# Patient Record
Sex: Male | Born: 1945 | Race: Black or African American | Hispanic: No | Marital: Single
Health system: Southern US, Community
[De-identification: ages and names within clinical notes are randomized; demographics above are authoritative.]

---

## 2007-12-20 ENCOUNTER — Ambulatory Visit: Payer: Self-pay | Admitting: Nephrology

## 2008-03-14 ENCOUNTER — Ambulatory Visit: Payer: Self-pay | Admitting: Ophthalmology

## 2008-03-21 ENCOUNTER — Ambulatory Visit: Payer: Self-pay | Admitting: Ophthalmology

## 2009-08-19 ENCOUNTER — Ambulatory Visit: Payer: Self-pay | Admitting: General Practice

## 2011-04-13 ENCOUNTER — Ambulatory Visit: Payer: Self-pay | Admitting: Urology

## 2011-04-29 ENCOUNTER — Ambulatory Visit: Payer: Self-pay | Admitting: Urology

## 2011-04-30 LAB — URINE CULTURE

## 2011-05-06 ENCOUNTER — Ambulatory Visit: Payer: Self-pay | Admitting: Urology

## 2011-06-18 ENCOUNTER — Ambulatory Visit: Payer: Self-pay

## 2011-06-19 ENCOUNTER — Ambulatory Visit: Payer: Self-pay | Admitting: Otolaryngology

## 2011-09-28 ENCOUNTER — Ambulatory Visit: Payer: Self-pay | Admitting: Urology

## 2011-09-29 LAB — URINE CULTURE

## 2011-09-30 ENCOUNTER — Ambulatory Visit: Payer: Self-pay | Admitting: Urology

## 2012-05-19 ENCOUNTER — Ambulatory Visit: Payer: Self-pay | Admitting: Otolaryngology

## 2012-06-12 ENCOUNTER — Ambulatory Visit: Payer: Self-pay | Admitting: Internal Medicine

## 2012-07-02 ENCOUNTER — Inpatient Hospital Stay: Payer: Self-pay | Admitting: Student

## 2012-07-02 LAB — COMPREHENSIVE METABOLIC PANEL
Albumin: 2 g/dL — ABNORMAL LOW (ref 3.4–5.0)
BUN: 174 mg/dL — ABNORMAL HIGH (ref 7–18)
Bilirubin,Total: 0.2 mg/dL (ref 0.2–1.0)
Co2: 21 mmol/L (ref 21–32)
Creatinine: 7.15 mg/dL — ABNORMAL HIGH (ref 0.60–1.30)
EGFR (African American): 9 — ABNORMAL LOW
Glucose: 195 mg/dL — ABNORMAL HIGH (ref 65–99)
SGOT(AST): 128 U/L — ABNORMAL HIGH (ref 15–37)
Total Protein: 8.9 g/dL — ABNORMAL HIGH (ref 6.4–8.2)

## 2012-07-02 LAB — URINALYSIS, COMPLETE
Bilirubin,UR: NEGATIVE
Ketone: NEGATIVE
Nitrite: NEGATIVE
Ph: 7 (ref 4.5–8.0)
Protein: 100
RBC,UR: 20 /HPF (ref 0–5)
Squamous Epithelial: NONE SEEN
WBC UR: 617 /HPF (ref 0–5)

## 2012-07-02 LAB — PROTIME-INR: Prothrombin Time: 16.7 secs — ABNORMAL HIGH (ref 11.5–14.7)

## 2012-07-02 LAB — CBC
HGB: 9.4 g/dL — ABNORMAL LOW (ref 13.0–18.0)
MCH: 26.6 pg (ref 26.0–34.0)
RBC: 3.52 10*6/uL — ABNORMAL LOW (ref 4.40–5.90)

## 2012-07-02 LAB — TROPONIN I: Troponin-I: <0.02 ng/mL

## 2012-07-02 LAB — POTASSIUM: Potassium: 6 mmol/L — ABNORMAL HIGH (ref 3.5–5.1)

## 2012-07-03 LAB — BASIC METABOLIC PANEL
Anion Gap: 9 (ref 7–16)
BUN: 156 mg/dL — ABNORMAL HIGH (ref 7–18)
BUN: 145 mg/dL — ABNORMAL HIGH (ref 7–18)
BUN: 134 mg/dL — ABNORMAL HIGH (ref 7–18)
Calcium, Total: 8.8 mg/dL (ref 8.5–10.1)
Calcium, Total: 8.5 mg/dL (ref 8.5–10.1)
Chloride: >128 mmol/L — ABNORMAL HIGH (ref 98–107)
Chloride: 128 mmol/L — ABNORMAL HIGH (ref 98–107)
Co2: 22 mmol/L (ref 21–32)
Co2: 21 mmol/L (ref 21–32)
Creatinine: 6.24 mg/dL — ABNORMAL HIGH (ref 0.60–1.30)
EGFR (African American): 10 — ABNORMAL LOW
EGFR (African American): 11 — ABNORMAL LOW
EGFR (African American): 13 — ABNORMAL LOW
EGFR (Non-African Amer.): 9 — ABNORMAL LOW
EGFR (Non-African Amer.): 10 — ABNORMAL LOW
Glucose: 155 mg/dL — ABNORMAL HIGH (ref 65–99)
Glucose: 162 mg/dL — ABNORMAL HIGH (ref 65–99)
Osmolality: 360 (ref 275–301)
Potassium: 4.5 mmol/L (ref 3.5–5.1)
Potassium: 4.5 mmol/L (ref 3.5–5.1)
Potassium: 4.2 mmol/L (ref 3.5–5.1)
Sodium: >160 mmol/L (ref 136–145)
Sodium: 158 mmol/L — ABNORMAL HIGH (ref 136–145)

## 2012-07-03 LAB — RETICULOCYTES
Absolute Retic Count: 0.0107 10*6/uL — ABNORMAL LOW
Reticulocyte: 0.36 % — ABNORMAL LOW

## 2012-07-03 LAB — MAGNESIUM: Magnesium: 3.2 mg/dL — ABNORMAL HIGH

## 2012-07-03 LAB — IRON AND TIBC
Iron Saturation: 11 %
Iron: 11 ug/dL — ABNORMAL LOW (ref 65–175)
Unbound Iron-Bind.Cap.: 92 ug/dL

## 2012-07-03 LAB — FERRITIN: Ferritin (ARMC): 362 ng/mL (ref 8–388)

## 2012-07-03 LAB — CK: CK, Total: 931 U/L — ABNORMAL HIGH (ref 35–232)

## 2012-07-03 LAB — SODIUM: Sodium: >160 mmol/L (ref 136–145)

## 2012-07-04 LAB — URINE CULTURE

## 2012-07-05 LAB — BASIC METABOLIC PANEL
Chloride: 119 mmol/L — ABNORMAL HIGH (ref 98–107)
Co2: 21 mmol/L (ref 21–32)
Creatinine: 3.92 mg/dL — ABNORMAL HIGH (ref 0.60–1.30)
Glucose: 165 mg/dL — ABNORMAL HIGH (ref 65–99)
Sodium: 150 mmol/L — ABNORMAL HIGH (ref 136–145)

## 2012-07-05 LAB — CBC WITH DIFFERENTIAL/PLATELET
Basophil #: 0 10*3/uL (ref 0.0–0.1)
Basophil #: 0 10*3/uL (ref 0.0–0.1)
Basophil %: 0.2 %
Eosinophil #: 0.1 10*3/uL (ref 0.0–0.7)
Eosinophil #: 0.1 10*3/uL (ref 0.0–0.7)
HCT: 20.4 % — ABNORMAL LOW (ref 40.0–52.0)
HCT: 21.4 % — ABNORMAL LOW (ref 40.0–52.0)
HGB: 6.7 g/dL — ABNORMAL LOW (ref 13.0–18.0)
Lymphocyte #: 0.9 10*3/uL — ABNORMAL LOW (ref 1.0–3.6)
Lymphocyte %: 14.7 %
Lymphocyte %: 16.8 %
MCHC: 32.9 g/dL (ref 32.0–36.0)
MCHC: 32.9 g/dL (ref 32.0–36.0)
Monocyte #: 0.4 x10 3/mm (ref 0.2–1.0)
Neutrophil #: 4.2 10*3/uL (ref 1.4–6.5)
Neutrophil %: 77.9 %
Platelet: 75 10*3/uL — ABNORMAL LOW (ref 150–440)
RBC: 2.55 10*6/uL — ABNORMAL LOW (ref 4.40–5.90)
RDW: 16.8 % — ABNORMAL HIGH (ref 11.5–14.5)

## 2012-07-05 LAB — HEPATIC FUNCTION PANEL A (ARMC)
Albumin: 1.3 g/dL — ABNORMAL LOW (ref 3.4–5.0)
Alkaline Phosphatase: 62 U/L (ref 50–136)
Bilirubin,Total: 0.2 mg/dL (ref 0.2–1.0)
SGOT(AST): 34 U/L (ref 15–37)
SGPT (ALT): 59 U/L (ref 12–78)
Total Protein: 6.3 g/dL — ABNORMAL LOW (ref 6.4–8.2)

## 2012-07-05 LAB — CK: CK, Total: 526 U/L — ABNORMAL HIGH (ref 35–232)

## 2012-07-06 LAB — BASIC METABOLIC PANEL
Anion Gap: 8 (ref 7–16)
BUN: 72 mg/dL — ABNORMAL HIGH (ref 7–18)
Calcium, Total: 7.6 mg/dL — ABNORMAL LOW (ref 8.5–10.1)
Chloride: 115 mmol/L — ABNORMAL HIGH (ref 98–107)
Co2: 21 mmol/L (ref 21–32)
Glucose: 117 mg/dL — ABNORMAL HIGH (ref 65–99)
Osmolality: 309 (ref 275–301)
Sodium: 144 mmol/L (ref 136–145)

## 2012-07-06 LAB — CBC WITH DIFFERENTIAL/PLATELET
Basophil #: 0 10*3/uL (ref 0.0–0.1)
Basophil %: 0.3 %
Eosinophil #: 0.2 10*3/uL (ref 0.0–0.7)
Eosinophil %: 1.5 %
MCH: 26.5 pg (ref 26.0–34.0)
MCHC: 33.6 g/dL (ref 32.0–36.0)
Monocyte #: 0.6 x10 3/mm (ref 0.2–1.0)
RDW: 17.3 % — ABNORMAL HIGH (ref 11.5–14.5)
WBC: 10.4 10*3/uL (ref 3.8–10.6)

## 2012-07-06 LAB — PROT IMMUNOELECTROPHORES(ARMC)

## 2012-07-06 LAB — KAPPA/LAMBDA FREE LIGHT CHAINS (ARMC)

## 2012-07-07 LAB — BASIC METABOLIC PANEL
Anion Gap: 9 (ref 7–16)
BUN: 63 mg/dL — ABNORMAL HIGH (ref 7–18)
Chloride: 113 mmol/L — ABNORMAL HIGH (ref 98–107)
Co2: 21 mmol/L (ref 21–32)
Creatinine: 3.03 mg/dL — ABNORMAL HIGH (ref 0.60–1.30)
EGFR (African American): 24 — ABNORMAL LOW
EGFR (Non-African Amer.): 21 — ABNORMAL LOW
Glucose: 99 mg/dL (ref 65–99)
Osmolality: 303 (ref 275–301)
Potassium: 4.2 mmol/L (ref 3.5–5.1)
Sodium: 143 mmol/L (ref 136–145)

## 2012-07-08 LAB — CBC WITH DIFFERENTIAL/PLATELET
Basophil #: 0 10*3/uL (ref 0.0–0.1)
Basophil %: 0.3 %
Eosinophil %: 1.8 %
Lymphocyte %: 19.6 %
MCH: 26.3 pg (ref 26.0–34.0)
Neutrophil #: 6.1 10*3/uL (ref 1.4–6.5)
Platelet: 134 10*3/uL — ABNORMAL LOW (ref 150–440)
RDW: 17.4 % — ABNORMAL HIGH (ref 11.5–14.5)
WBC: 8.4 10*3/uL (ref 3.8–10.6)

## 2012-07-08 LAB — BASIC METABOLIC PANEL
Anion Gap: 6 — ABNORMAL LOW (ref 7–16)
BUN: 51 mg/dL — ABNORMAL HIGH (ref 7–18)
Co2: 22 mmol/L (ref 21–32)
Creatinine: 2.65 mg/dL — ABNORMAL HIGH (ref 0.60–1.30)
EGFR (African American): 28 — ABNORMAL LOW
EGFR (Non-African Amer.): 24 — ABNORMAL LOW
Osmolality: 297 (ref 275–301)
Sodium: 142 mmol/L (ref 136–145)

## 2012-07-09 LAB — BASIC METABOLIC PANEL
Anion Gap: 9 (ref 7–16)
BUN: 47 mg/dL — ABNORMAL HIGH (ref 7–18)
Calcium, Total: 7.3 mg/dL — ABNORMAL LOW (ref 8.5–10.1)
Co2: 19 mmol/L — ABNORMAL LOW (ref 21–32)
Creatinine: 2.64 mg/dL — ABNORMAL HIGH (ref 0.60–1.30)
EGFR (African American): 28 — ABNORMAL LOW
Osmolality: 290 (ref 275–301)
Potassium: 4.4 mmol/L (ref 3.5–5.1)
Sodium: 139 mmol/L (ref 136–145)

## 2012-07-09 LAB — CULTURE, BLOOD (SINGLE)

## 2012-07-10 LAB — CBC WITH DIFFERENTIAL/PLATELET
Basophil #: 0 10*3/uL (ref 0.0–0.1)
Basophil %: 0.3 %
Eosinophil #: 0.1 10*3/uL (ref 0.0–0.7)
Eosinophil %: 0.7 %
HGB: 7.6 g/dL — ABNORMAL LOW (ref 13.0–18.0)
Lymphocyte #: 1.7 10*3/uL (ref 1.0–3.6)
MCH: 26.2 pg (ref 26.0–34.0)
MCHC: 32.8 g/dL (ref 32.0–36.0)
Neutrophil #: 7.8 10*3/uL — ABNORMAL HIGH (ref 1.4–6.5)
Platelet: 159 10*3/uL (ref 150–440)
RDW: 17.3 % — ABNORMAL HIGH (ref 11.5–14.5)
WBC: 10.1 10*3/uL (ref 3.8–10.6)

## 2012-07-10 LAB — BASIC METABOLIC PANEL
Anion Gap: 7 (ref 7–16)
BUN: 44 mg/dL — ABNORMAL HIGH (ref 7–18)
Calcium, Total: 7.8 mg/dL — ABNORMAL LOW (ref 8.5–10.1)
Chloride: 108 mmol/L — ABNORMAL HIGH (ref 98–107)
Co2: 22 mmol/L (ref 21–32)
Creatinine: 2.49 mg/dL — ABNORMAL HIGH (ref 0.60–1.30)
EGFR (African American): 30 — ABNORMAL LOW
EGFR (Non-African Amer.): 26 — ABNORMAL LOW
Glucose: 110 mg/dL — ABNORMAL HIGH (ref 65–99)
Sodium: 137 mmol/L (ref 136–145)

## 2012-07-10 LAB — OCCULT BLOOD X 1 CARD TO LAB, STOOL: Occult Blood, Feces: NEGATIVE

## 2012-07-11 LAB — BASIC METABOLIC PANEL
BUN: 44 mg/dL — ABNORMAL HIGH (ref 7–18)
Calcium, Total: 7.9 mg/dL — ABNORMAL LOW (ref 8.5–10.1)
Creatinine: 2.61 mg/dL — ABNORMAL HIGH (ref 0.60–1.30)
EGFR (African American): 29 — ABNORMAL LOW
EGFR (Non-African Amer.): 25 — ABNORMAL LOW
Osmolality: 288 (ref 275–301)
Potassium: 5.4 mmol/L — ABNORMAL HIGH (ref 3.5–5.1)
Sodium: 139 mmol/L (ref 136–145)

## 2012-07-11 LAB — CBC WITH DIFFERENTIAL/PLATELET
Basophil #: 0 10*3/uL (ref 0.0–0.1)
Basophil %: 0.3 %
Eosinophil #: 0 10*3/uL (ref 0.0–0.7)
HGB: 7.3 g/dL — ABNORMAL LOW (ref 13.0–18.0)
Lymphocyte %: 10.3 %
MCH: 26.3 pg (ref 26.0–34.0)
MCHC: 32.7 g/dL (ref 32.0–36.0)
Monocyte #: 0.6 x10 3/mm (ref 0.2–1.0)
Monocyte %: 5.3 %
Neutrophil %: 84 %
Platelet: 168 10*3/uL (ref 150–440)
RBC: 2.76 10*6/uL — ABNORMAL LOW (ref 4.40–5.90)
RDW: 17.3 % — ABNORMAL HIGH (ref 11.5–14.5)
WBC: 11.5 10*3/uL — ABNORMAL HIGH (ref 3.8–10.6)

## 2012-07-12 ENCOUNTER — Ambulatory Visit: Payer: Self-pay | Admitting: Internal Medicine

## 2012-07-12 LAB — CBC WITH DIFFERENTIAL/PLATELET
Basophil #: 0.1 10*3/uL (ref 0.0–0.1)
Basophil %: 1.2 %
Eosinophil %: 1.7 %
HCT: 22.7 % — ABNORMAL LOW (ref 40.0–52.0)
HGB: 7.2 g/dL — ABNORMAL LOW (ref 13.0–18.0)
Lymphocyte #: 1.6 10*3/uL (ref 1.0–3.6)
MCHC: 31.7 g/dL — ABNORMAL LOW (ref 32.0–36.0)
MCV: 80 fL (ref 80–100)
Monocyte %: 6.8 %
Neutrophil #: 3.3 10*3/uL (ref 1.4–6.5)
Platelet: 192 10*3/uL (ref 150–440)
RBC: 2.82 10*6/uL — ABNORMAL LOW (ref 4.40–5.90)
RDW: 17.2 % — ABNORMAL HIGH (ref 11.5–14.5)
WBC: 5.4 10*3/uL (ref 3.8–10.6)

## 2012-07-12 LAB — BASIC METABOLIC PANEL
BUN: 42 mg/dL — ABNORMAL HIGH (ref 7–18)
Chloride: 107 mmol/L (ref 98–107)
Co2: 25 mmol/L (ref 21–32)
Glucose: 87 mg/dL (ref 65–99)
Osmolality: 286 (ref 275–301)
Sodium: 138 mmol/L (ref 136–145)

## 2012-08-12 ENCOUNTER — Ambulatory Visit: Payer: Self-pay | Admitting: Internal Medicine

## 2012-08-12 DEATH — deceased

## 2014-05-01 NOTE — Op Note (Signed)
PATIENT NAME:  Timothy [name], Timothy [name] MR#:  [id number] DATE OF BIRTH:  September 06, 1945  DATE OF PROCEDURE:  09/30/2011  PREOPERATIVE DIAGNOSES:  1. Bilateral ureteropelvic junction obstruction.  2. Urinary retention.   POSTOPERATIVE DIAGNOSES:  1. Bilateral ureteropelvic junction obstruction.  2. Urinary retention.   PROCEDURE: Cystoscopy with stent removal, bilateral.   SURGEON: Irineo AxonScott Burman Bruington, MD  ASSISTANT: None.   ANESTHESIA: General.   INDICATIONS: [name] 69 year old male with history of bilateral hydronephrosis and rising creatinine. He also has poor bladder emptying with residual between 800 to 1,000 mL. He underwent cystoscopy with bilateral retrograde pyelograms in April 2013 within normal appearing ureters and findings consistent with bilateral UPJ obstruction. He had bilateral ureteral stents placed; however, his creatinine has continued to rise. He is followed by nephrology and it is felt his deterioration in renal function may be secondary to poor bladder emptying. He presents for stent removal. Preoperative urine culture was negative.   DESCRIPTION OF PROCEDURE: The patient was taken to the Operating Room where [name] general anesthetic was administered. He was placed in the low lithotomy position and his external genitalia were prepped and draped in the usual fashion. [name] 21 French cystoscope with 30 degree lens was lubricated and passed under direct vision. The urethra was normal in caliber without stricture. The prostate shows minimal lateral lobe enlargement and moderate bladder neck elevation. Bladder mucosa was closely inspected and there was moderate trabeculation present. The bladder was distended upon scope entry and was drained of approximately 900 mL of clear urine. Both stents were able to be grasped with [name] grasping forceps and removed simultaneously without difficulty. The patient has Down's syndrome and his sister is his caretaker and states he would not be able to tolerate [name] Foley catheter. She  and family members have expressed interest in learning to perform intermittent catheterization. He was taken to the postanesthesia care unit in stable condition. They will followup in the office for instructions on self-catheterization. ____________________________ Verna CzechScott C. Lonna CobbStoioff, MD scs:slb D: 09/30/2011 13:25:25 ET T: 09/30/2011 13:38:25 ET JOB#: [id number]  cc: Lorin PicketScott C. Lonna CobbStoioff, MD, <Dictator> Amy K. Mottl, MD  Riki AltesSCOTT C Rhianna Raulerson MD ELECTRONICALLY SIGNED 10/09/2011 8:18

## 2014-05-04 NOTE — Consult Note (Signed)
Pt seen and examined. Full consult to follow. Family at bedside. Pt not able to provide any hx. Anemia. Stool neg for blood. Many potential causes. C/O abd pain yest? No previous GI hx. No previous colonoscopy. Unless patient actively bleeds with drop in hgb, no immediate plans for endoscopies. Doubt patient will drink bowel prep to undergo colonoscopy. Could consider EGD but only if family insists. Thanks.  Electronic Signatures: Lutricia Feilh, Aidden Markovic (MD)  (Signed on 30-Jun-14 16:04)  Authored  Last Updated: 30-Jun-14 16:04 by Lutricia Feilh, Hani Campusano (MD)

## 2014-05-04 NOTE — Consult Note (Signed)
PATIENT NAME:  Timothy [name], Timothy [name] MR#:  [id number] DATE OF BIRTH:  Feb 27, 1945  DATE OF CONSULTATION:  07/11/2012  CONSULTING PHYSICIAN:  Ezzard Standing. Tylena Prisk, MD  REASON FOR REFERRAL: Possible iron deficiency anemia.   HISTORY OF PRESENT ILLNESS: The patient is a 69 year old African-American male with Down syndrome and seizure disorder, who was brought in by sister due to weakness and frequent falls. Because of the patient's significant Down syndrome, the patient is unable to provide any history. When he was brought into the emergency room, he was found to be hypotensive with a potassium level at 6.1. He was in acute renal failure. I was asked to see the patient because of persistent anemia.   The patient is unable to provide any history at all. Some history was provided by the sister. According to the sister, the patient has never had any GI symptoms. He had some bleeding from his urine when he presented, but no signs of gross hematochezia or melena. There are no problems with nausea, vomiting, heartburn or indigestion. It appears that he may have complained of some stomachaches yesterday, but none today. The patient never had any prior colonoscopy or upper endoscopy. There is no history of ulcers or polyps in the past.   PAST MEDICAL HISTORY: Notable for Down syndrome and seizure disorder.   PAST SURGICAL HISTORY: Includes cystoscopy and cataract surgery.   SOCIAL HISTORY: There is no smoking or drinking history.   ALLERGIES: There are no allergies to medication.   MEDICATIONS: The only thing he was taking at home included Centrum, iron and Flomax.    REVIEW OF SYMPTOMS: Not obtainable because of the patient's Down syndrome.  PHYSICAL EXAMINATION:  GENERAL: The patient does not appear to be in any acute distress. The patient is rather thin and weak appearing.  VITAL SIGNS: He is afebrile. Vital signs are stable. At this time, he is no longer hypotensive.  HEAD AND NECK: Showed normocephalic, atraumatic  head. Pupils are equally reactive. Throat was clear. Neck was supple.  CARDIAC: Revealed regular rhythm and rate without murmurs.  LUNGS: Clear bilaterally.  ABDOMEN: Showed normoactive bowel sounds. Soft, nontender. There is no hepatomegaly. I did not appreciate any abdominal tenderness at all.  EXTREMITIES: Showed no clubbing, cyanosis or edema.   DIAGNOSTIC STUDIES: CT scan on admission shows chronic-appearing subdural hematoma. Most recent labs from June 30th showed a sodium of 139, potassium 5.4, chloride 110, CO2 24, BUN is 44, creatinine 2.61. White count is 11.6, hemoglobin 7.3, which is a gradual drop, platelet count is 160, MCV 81. PSA is 0.1. Erythropoietin level is high at 55.7. Hepatitis C antibody is negative. HIV status is negative. Most importantly, stool test was negative for blood.   IMPRESSION: This is a patient with possible iron deficiency anemia, although I do not see any iron level or ferritin levels in his chart. The patient had some bleeding from the urine when he presented. There are no signs of internal gastrointestinal bleeding. There are many potential causes of his anemia, including anemia of chronic disease. At this point, I would not have any immediate plans for endoscopies unless the patient shows active bleeding with drop in hemoglobin. I doubt that the patient would be able to drink the bowel prep even to undergo a colonoscopy. Could consider doing an upper endoscopy later if there are truly signs of gastrointestinal bleeding, but only if the family would insist on doing it. I will not follow up unless I was asked to follow up.  Thank you for the referral.    ____________________________ Ezzard StandingPaul Y. Bluford Kaufmannh, MD pyo:OSi D: 07/13/2012 07:46:00 ET T: 07/13/2012 08:50:32 ET JOB#: [id number]  cc: Ezzard StandingPaul Y. Bluford Kaufmannh, MD, <Dictator> Ezzard StandingPAUL Y Harlem Bula MD ELECTRONICALLY SIGNED 07/13/2012 15:30

## 2014-05-04 NOTE — Consult Note (Signed)
   Comments   I met with pt's sister with whom he lives. Updated her on pt's current medical condition. She and family realize that pt is not likely to return to his previous baseline. We discussed the option of pt returning home with hospice services. Sister is very pleased with this idea and would like to pursue. Hospice screen ordered. Discussed with CSW.   Electronic Signatures: Saxton Chain, Izora Gala (MD)  (Signed 01-Jul-14 11:46)  Authored: Palliative Care   Last Updated: 01-Jul-14 11:46 by Abir Eroh, Izora Gala (MD)

## 2014-05-04 NOTE — Consult Note (Signed)
HEMATOLOGY followup note - resting in bed, weak. Unable to porvide history.  patient's family at bedside, denies any obvious bleeding symptoms.    NAD          vitals - afebrile, stable           lungs - bilateral good BS          abd - soft  hemoglobin 7.6, platelets 159, WBC 10100, ANC 7800, Cr 2.49.  male patient with multifactorial anemia secondary to likely blood loss, infection, renal insufficiency alongwith possibility of ? underlying MDS. Agree with ongoing treatment, conitnue to monitor and transfuse PRBC if Hb is 7 or less. Otherwise WBC count is in normal range and platelet count is in the low normal range.   No new hematology recommendations. If patient is discharged soon, [facility] for continued management of anemia. Patient's family explained above, they are agreeable to this plan.  Electronic Signatures: Izola PricePandit, Irelyn Perfecto Raj (MD)  (Signed on 29-Jun-14 21:53)  Authored  Last Updated: 29-Jun-14 21:53 by Izola PricePandit, Rheanne Cortopassi Raj (MD)

## 2014-05-04 NOTE — H&P (Signed)
PATIENT NAME:  Timothy [name], Compton A MR#:  [id number] DATE OF BIRTH:  27-Apr-1945  DATE OF ADMISSION:  07/02/2012  PRIMARY CARE PHYSICIAN:  Dr. Zada Finderslmedo.   REFERRING PHYSICIAN:  Dr. Mayford KnifeWilliams.  CHIEF COMPLAINT:  Frequent falls, unable to walk and weakness today.   HISTORY OF PRESENT ILLNESS:  The patient is a 69 year old African American male with a history of Down syndrome, seizure disorder, was brought to the ED from home by his sister due to frequent falls, unable to walk and weakness today.  The patient has Down syndrome, unable to provide any information.  According to the patient's sister, the patient has had some confusion and weakness, poor oral intake for the past 2 days and the patient has frequent fall, unable to walk, but she did not notice anything else.  He was brought to the ED for further evaluation.  The patient was found to have hypotension with BP in the 70s.  In addition, the patient's potassium is 6.1, sodium is more than 160, BUN 174, creatinine 1.15.  The patient was treated with a normal saline bolus, insulin and D50.  Dr. Mayford KnifeWilliams discussed the patient's condition with the patient's family member, they want to treat the patient.  Dr. Mayford KnifeWilliams ordered palliative care consult.   PAST MEDICAL HISTORY:  Down syndrome, seizure disorder.   PAST SURGICAL HISTORY:  Cystoscopy with stent, cataract surgery, ear surgery.   FAMILY HISTORY:  Unknown.   SOCIAL HISTORY:  Living with his sister.  No smoking or drinking or illicit drugs.   ALLERGIES:  No.   MEDICATIONS:  Centrum Silver oral tablets 1 tab a day, ferrous sulfate 325 mg by mouth twice daily, Flomax 0.4 mg by mouth daily.   REVIEW OF SYSTEMS:  Unable to obtain due to patient's Down syndrome and confusion status.    PHYSICAL EXAMINATION:  VITAL SIGNS:  Temperature 98.8, blood pressure was 75/42, pulse 119, O2 saturation 100% on room air and blood pressure now increased to 126/64.  GENERAL:  The patient is weak, confused, demented,  very thin, in no acute distress, but uncooperative.  HEENT:  Pupils are round, equal, reactive to light.  No discharge from ear or nose.  Dry oral mucosa.  NECK:  Supple.  No JVD or carotid bruit.  No lymphadenopathy.  No thyromegaly.  CARDIOVASCULAR:  S1 and S2, regular rate, tachycardia, no murmurs or gallops.  PULMONARY:  Bilateral air entry.  No wheezing or rales.  No use of accessory muscles to breathe.  ABDOMEN:  Soft.  No distention.  No organomegaly.  Bowel sounds present.  Unable to estimate whether the patient has abdominal pain.  EXTREMITIES:  No edema, clubbing or cyanosis.   NEUROLOGIC:  Unable to examine due to patient's mental status.  SKIN:  No rash or jaundice.  Very poor skin turgor.   LABORATORY DATA:  CAT scan of head showed small subacute to chronic-appearing subdural hematoma on the left, moderate to severe involutional changes.  WBC 12.5, hemoglobin 9.4, platelets 108, glucose 195, BUN 174, creatinine 7.15, sodium more than 160, potassium 6.1, chloride more than 128, bicarb of 21, bilirubin 0.2, SGPT 138, SGOT of 128.  Troponin less than 0.02, INR 1.4.  EKG shows sinus tachycardia at 125 bpm.   IMPRESSION: 1.  Hypotension, possibly due to dehydration, acute renal failure.  2.  Tachycardia.  3.  Acute renal failure.  4.  Severe hypernatremia.  5.  Severe hyperkalemia.  6.  Leukocytosis, need to rule out urinary tract infection.  7.  Anemia.  8.  Thrombocytopenia.  9.  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  10.  Down syndrome.  11.  Seizure disorder.   PLAN OF TREATMENT: 1.  The patient will be admitted to the tele floor.  We will start D5 water IV and follow up potassium level today and also follow up BMP.  2.  We will get a urinalysis, urine culture and then start Rocephin.   3.  Follow up palliative care consult.  4.  Discussed the patient's critical condition with the patient's sister and the two nieces.  The patient's sister is health care power  of attorney and she decided DO NOT RESUSCITATE, but all the family members want treatment for patient's current condition.   TIME SPENT:  About 56 minutes.     ____________________________ Shaune Pollack, MD qc:ea D: 07/02/2012 18:48:43 ET T: 07/02/2012 19:15:26 ET JOB#: [id number]  cc: Shaune Pollack, MD, <Dictator> Shaune Pollack MD ELECTRONICALLY SIGNED 07/03/2012 14:28

## 2014-05-04 NOTE — Consult Note (Signed)
PATIENT NAME:  Timothy [name], Timothy [name] MR#:  [id number] DATE OF BIRTH:  January 29, 1945  DATE OF CONSULTATION:  07/05/2012  CONSULTING PHYSICIAN:  Knute Neuobert G. Lorre NickGittin, MD  HISTORY OF PRESENT ILLNESS:  Timothy [name] is [name] 69 year old patient who was admitted on 06/21. He was brought to the Emergency Room with frequent falls, unable to walk, increased weakness. The patient could not give any history. He had been noted by [name] caregiver to have poor oral intake and increased weakness. He was hypotensive, blood pressure in the 70s. He had [name] high potassium of 6.1, [name] sodium over 160, creatinine over 7.0.  He was given saline insulin D50. He was treated with intravenous fluids for hypotension. He was treated with fluids for acute renal failure. He was treated with appropriate fluids for hypernatremia, and his hyperkalemia was treated. He was noted to have [name] urinary tract infection.  Urine was cultured, blood was cultured after delay.  He was started initially on Rocephin and antibiotics were adjusted. He was noted to have anemia and thrombocytopenia. He was shown on CT scan of the head, noncontrast, to have [name] subacute to chronic subdural hematoma on the left.   PAST MEDICAL AND SURGICAL HISTORY: Includes Down syndrome and seizure disorder. There was no history of kidney disease or of [name] hematologic disorder. However, he does apparently have [name] history of bladder urethral stricture and prior stenting and evaluation by GU. He has had cystoscopy in the past and also cataract surgery and ear surgery.   FAMILY HISTORY: Not obtainable.   SOCIAL HISTORY: Lives with his sister, who is his caregiver. No smoking. No alcohol.   ALLERGIES:  No known.   MEDICATIONS: He was on iron and multivitamins and Flomax.   SYSTEM REVIEW: Unobtainable. The patient is confused. He was obtunded and confused on admission.  He is more alert and cooperative but still has significant cognitive impairment.   PHYSICAL EXAMINATION: GENERAL: He is alert, does not  really follow commands or cooperate but not in any distress. No jaundice. No thrush.  Very thin, cachectic appearance.  LYMPH: No palpable lymph nodes in the neck, supraclavicular, submandibular or axilla.  LUNGS:  No wheezing or rales.  HEART: Regular.  ABDOMEN: Nontender. No palpable mass or organomegaly.  EXTREMITIES: No edema.   NEUROLOGICAL:  No gross weakness.  Cranial nerves are intact. He was moving his extremities. Otherwise, could not examine him for fine movements or for subtle weakness. He has no rash or significant bruising.   LABORATORY AND RADIOLOGICAL DATA:  As noted, the noncontrast head CT showed [name] small subacute-to-chronic-appearing subdural hematoma with moderate-to-severe involutional changes.  On admission, his white count was 12.5, hemoglobin was 9.4, platelets 108, creatinine was 7.15, sodium over 160, bilirubin 0.2, SGOT was 160, SGPT was 138. Troponin was low.  INR was 1.4. With hydration, his sodium has come down to 150. His hemoglobin has come down to 6.9 and 7 today. As platelets came down slightly, they were 75,000 this morning, and this afternoon they had held to 75,000. LDH today was normal at 142. [name] Coombs test was negative. The patient's neutrophil count is unremarkable. He has some lymphopenia. Creatinine is improved and down now to 3.9. He has good urine output. He has had significant hematuria since admission. It is clearing significantly. Also, the patient was transfused 1 unit of blood earlier today.   IMPRESSION: From [name] hematology point of view, the patient has some leukocytosis which is likely due to acute infection. Will watch to see  that that resolves. His lymphopenia was just likely due to acute infection. We will see if that resolved. He has significant anemia and with additional hydration likely to unmask more anemia.  He has hematuria that could have been the entire cause, but he is certainly at risk of other hematologic disorder. He has had azotemia.  It   is unknown how [place] he has been azotemic, and certainly chronic disease and azotemia would be [name] cause of significant anemia. He has Down syndrome, and those patients have [name] higher incidence of myelodysplasia. There is no evidence of acute hemolysis. He has thrombocytopenia. His spleen is normal size. This is multifactorial, possibly. Could be [name] result of infection. He may or may not have chronic thrombocytopenia. This could be related to myelodysplasia. It might be related to [name] myeloma which should be screened for, possibly ITP. I do not suspect TTP with creatinine due to other causes, getting better with hydration. LDH normal and the platelets holding. He has, as stated, [name] slightly prolonged pro time, likely nutritional. No blasts were reported in the smear. An EPO level is pending.   PLAN: The patient is with multifactorial anemia and thrombocytopenia. Iron saturation was low, but in the presence of acute infection this can  be spurious; so he may be shown to have iron deficiency later, but this is not established, and he actually has been on oral iron. The plan is to review the outstanding B12 level, erythropoietin  level, light chains in the serum SIEP, follow up on the liver function tests and follow up on the creatinine. Get serial CBCs. Urology to investigate hematuria. An ultrasound has been done. No obvious pathology in the liver.  Looks like [name] benign lesion.  There are renal cysts, no obvious renal mass. The spleen looked normal. I will review [name] peripheral smear. I will otherwise double check with caregiver about prior history, but there was no stated history of anemia or thrombocytopenia prior.   ____________________________ Knute Neu Lorre Nick, MD rgg:cb D: 07/05/2012 19:51:30 ET T: 07/05/2012 22:51:36 ET JOB#: [id number]  cc: Knute Neu. Lorre Nick, MD, <Dictator> Marin Roberts MD ELECTRONICALLY SIGNED 07/27/2012 18:29

## 2014-05-04 NOTE — Discharge Summary (Signed)
PATIENT NAME:  Timothy [name], Timothy [name] MR#:  [id number] DATE OF BIRTH:  1945/09/29  DATE OF ADMISSION:  07/02/2012  ADMITTING PHYSICIAN:  Dr. Imogene Burn  DATE OF DISCHARGE:  07/13/2012  DISCHARGING PHYSICIAN:  Dr. Enid [name]  PRIMARY PHYSICIAN: Dr. Zada Finders.  CONSULTATIONS IN THE HOSPITAL:  1.  Nephrology consultation by Dr. Cherylann [name] and Dr. Thedore Mins.  2.  Palliative Care consultation by Dr. Harvie Junior.  3.  Hematology and Oncology consultation by Dr. Lorre Nick.    DISCHARGE DIAGNOSES:  1.  Sepsis. 2.  Klebsiella urinary tract infection.  3.  Acute renal failure.  4.  Underlying chronic kidney disease, as noted on CT abdomen. However, no previous creatinine to compare with.  5.  Chronic moderate right-sided hydronephrosis.  6.  Acute on chronic anemia requiring 1 unit packed RBC transfusion this admission.  7.  Down syndrome.  8.  Severe malnutrition.  9.  Hypernatremia.  10.  Thrombocytopenia.  11.  Weight loss.  12.  Chronic small subdural hematoma.   DISCHARGE MEDICATIONS:  1.  Flomax 0.4 mg p.o. daily.  2.  Centrum Silver multivitamin 1 tablet p.o. daily.  3.  Ferrous sulfate 325 mg p.o. b.i.d.  4.  Roxanol 20 mg per mL concentric 0.25 mL every 2 hours as needed for pain and dyspnea.  5.  Ativan 1 mg tablet every 4 hours as needed for agitation and anxiety.  6.  Ensure 1 can every day.   DISCHARGE DIET: Regular diet and pureed consistency with thin liquids. However, strict aspiration precautions recommended and meds to be crushed in puree and no straws recommended.  Moisten foods.  ACTIVITY:  As tolerated.   DISCHARGE INSTRUCTIONS: The patient will be discharged with home hospice services.   LABS AND IMAGING STUDIES:  Prior to discharge, WBC 5.4, hemoglobin 10.2, hematocrit 22.7, platelet count 192. Sodium 138, potassium 5.1, chloride 107, bicarb 25, BUN 42, creatinine 2.6, glucose 87, and calcium of 8.0.   CT of the abdomen and pelvis without contrast showing no acute disease in the chest,  abdomen or pelvis. It was done for chronic weight loss. PSA was within normal limits at 0.1. Stool for occult blood remained negative. CT head without contrast, which was done for followup of admission CT scan showing changes of atrophy and chronic microvascular ischemic disease. Tiny subdural hematoma in the anterolateral part of the left frontal lobe inferiorly, which is stable. No significant mass effect or diffuse atrophy noted.   BRIEF HOSPITAL COURSE:  For more details, please also look at the history and physical dictated by Dr. Imogene Burn on 07/02/2012, and also the interim summary dictated by Dr. Jacques Navy on 07/08/2012. In brief, Timothy [name] is a 69 year old African-American male with past medical history significant for Down syndrome, seizure disorder, who was brought in by his sister secondary to frequent falls and inability to walk, going on for less than a week prior to admission. The patient was found to be in severe renal failure and significant hypernatremia, with sodium >160  and creatinine of  7.15 on admission.   1.  Acute renal failure and significant hypernatremia, likely ATN and also prerenal volume deficiency, as he was also hypertensive on presentation. He was given IV fluids. No dialysis was done during this hospitalization. He was on D5 and half-normal saline that improved his sodium. The likely cause of hypernatremia was free water deficiency from poor p.o. intake of free water. His renal function improved, and his creatinine remained stable at 2.6. Renal ultrasound did show some  chronic changes of the kidneys. He probably has underlying CKD. However, no previous lab work was done in the last several years, so no previous labs to compare his old creatinine. He also had nonobstructing right-sided moderate hydronephrosis, which also appeared chronic. He does not have any acute obstruction at this time, and because the patient is going home with hospice, no further consults have been requested for  the same.   2.  Sepsis on admission, likely secondary to Klebsiella urinary tract infection. He was treated with Rocephin and finished off his course of 10 days of antibiotics while in the hospital. His blood pressure improved and his white count normalized.   3.  Acute on chronic anemia. The patient has underlying anemia, likely from his renal disease versus unknown losses of blood. Hemoccult remained negative. He is not a good candidate for colonoscopy because of inability to drink the colon prep, as he was not cooperative, and GI was consulted for the same, but has not planned to do any procedures at this time. The patient  did not require  transfusion during this hospitalization.   4.  Severe malnutrition. Dietary was consulted. He was started on Ensure while in the hospital.   5.  Chronic weight loss. Whole-body CT scan was done without contrast to see if any source of malignancy could be identified, but none found. He probably has some underlying malignancy. PSA was within normal limits. The patient's clinical condition has been deteriorating while in the hospital. Palliative Care has been consulted. Discussed with patient's sister, who wanted to take patient home with hospice services. The patient's code status has been changed to reflect the same. He is a DO NOT RESUSCITATE at this time.   6.  Chronic small subdural hematoma on the left frontal side. History of seizure disorder, not on any seizure medications. It has been stable on repeat CAT scans. No further testing done at this time. The patient is being discharged home with hospice services at this time.   Additional time spent is 35 minutes.    ____________________________ Timothy Baasadhika Timothy Echeverry, MD rk:mr D: 07/13/2012 15:10:00 ET T: 07/13/2012 19:59:56 ET JOB#: [id number]  cc: Dione HousekeeperMario Ernesto Olmedo, MD Timothy Baasadhika Mieczyslaw Stamas, MD, <Dictator>  Timothy BaasADHIKA Bernice Mcauliffe MD ELECTRONICALLY SIGNED 08/08/2012 13:20

## 2014-05-04 NOTE — Consult Note (Signed)
Chief Complaint:  Subjective/Chief Complaint AGGITATED WITH POSITIONING AND DRESSING CHANGE, DOES NOT COMMUNICATE   VITAL SIGNS/ANCILLARY NOTES: **Vital Signs.:   27-Jun-14 16:01  Vital Signs Type Q 4hr  Temperature Temperature (F) 98.1  Celsius 36.7  Temperature Source axillary  Pulse Pulse 97  Respirations Respirations 20  Systolic BP Systolic BP 92  Diastolic BP (mmHg) Diastolic BP (mmHg) 60  Mean BP 70  Pulse Ox % Pulse Ox % 98  Pulse Ox Activity Level  At rest  Oxygen Delivery Room Air/ 21 %   Brief Assessment:  Additional Physical Exam NO GROSS FOCAL WEAKNESS, DDECUBITUS, THIN, NO COUGH OR WHEEZING, NO RASH OR BRUISING   Lab Results: Routine Chem:  27-Jun-14 06:05   Glucose, Serum  104  BUN  51  Creatinine (comp)  2.65  Sodium, Serum 142  Potassium, Serum 4.2  Chloride, Serum  114  CO2, Serum 22  Calcium (Total), Serum  7.8  Anion Gap  6  Osmolality (calc) 297  eGFR (African American)  28  eGFR (Non-African American)  24 (eGFR values <36m/min/1.73 m2 may be an indication of chronic kidney disease (CKD). Calculated eGFR is useful in patients with stable renal function. The eGFR calculation will not be reliable in acutely ill patients when serum creatinine is changing rapidly. It is not useful in  patients on dialysis. The eGFR calculation may not be applicable to patients at the low and high extremes of body sizes, pregnant women, and vegetarians.)  Routine Hem:  27-Jun-14 06:05   WBC (CBC) 8.4  RBC (CBC)  3.37  Hemoglobin (CBC)  8.9  Hematocrit (CBC)  27.0  Platelet Count (CBC)  134  MCV 80  MCH 26.3  MCHC 32.9  RDW  17.4  Neutrophil % 73.1  Lymphocyte % 19.6  Monocyte % 5.2  Eosinophil % 1.8  Basophil % 0.3  Neutrophil # 6.1  Lymphocyte # 1.6  Monocyte # 0.4  Eosinophil # 0.1  Basophil # 0.0 (Result(s) reported on 08 Jul 2012 at 07:19AM.)   Assessment/Plan:  Assessment/Plan:  Assessment SEE ALSO EARLIER DICTATION  WBC BACK TO NORMAL,   PLTS SLOWLY IMPROVING,  STILL ANEMIC..Marland KitchenIKELY FROM BLOOD LOSS OR POSSIBLE  MDS, HIS EPO LEVEL WAS ELEVATED COMPATABLE WITH PRIMARY BM DISEASE OR RESPONSE TO BLOOD LOSS AND UNLIKE WHAT IS EXPECTED IN RENAL FAILURE OR ACUTE INFECTION.  AS PRIOR NOTED IRON STUDIES NOT DIAGNOSTIC IN FACE OF ACUTE INFECTION. SIEP UNREMARKABLE, LIGHT CHAINS HIGHKAPPA AND LABBDA BUT RATIO NORMAL SO ESSENTIALLY NEGATIVE. COOMBS NEG, HEP C AND HIV NEG.   SUGGEST F/U HGB AND PLTS. LATER CAN RECHECK IRON STUDIES. NO ROLE FOR PROCRIT NOW.  NO ROLE FOR BM OR OTHER STUDIES NOW.   [facility]   Electronic Signatures: GDallas Schimke(MD)  (Signed 27-Jun-14 16:14)  Authored: Chief Complaint, VITAL SIGNS/ANCILLARY NOTES, Brief Assessment, Lab Results, Assessment/Plan   Last Updated: 27-Jun-14 16:14 by GDallas Schimke(MD)

## 2014-07-24 IMAGING — US ABDOMEN ULTRASOUND
1 series · 13 of 25 positions shown · non-contrast
Comparison: none

REASON FOR EXAM: abnormal LFT, ARF
COMMENTS:

PROCEDURE:     US  - US ABDOMEN GENERAL SURVEY  - July 03, 2012  [DATE]
RESULT:

[Series 1: abdomen ultrasound · 0.20mm/px · 13 of 123 slices shown]
[im 1/123]
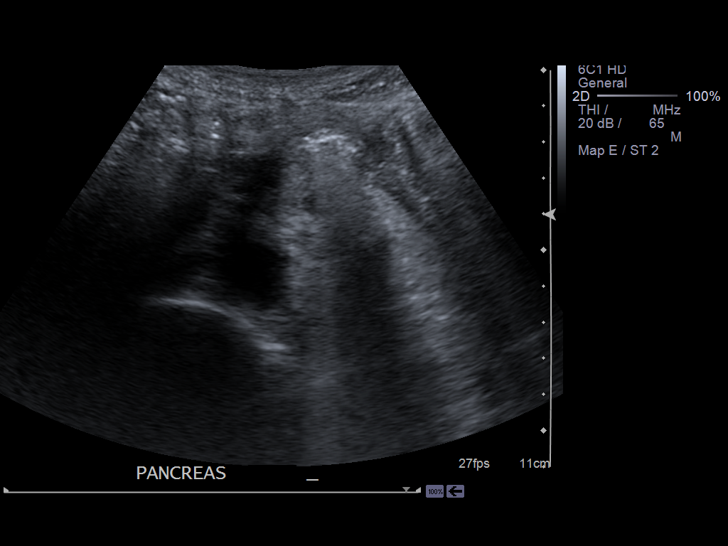
[im 11/123]
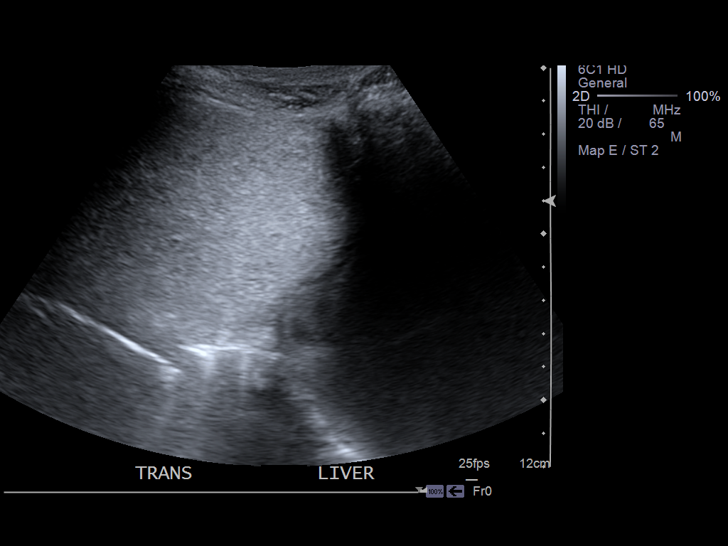
[im 21/123]
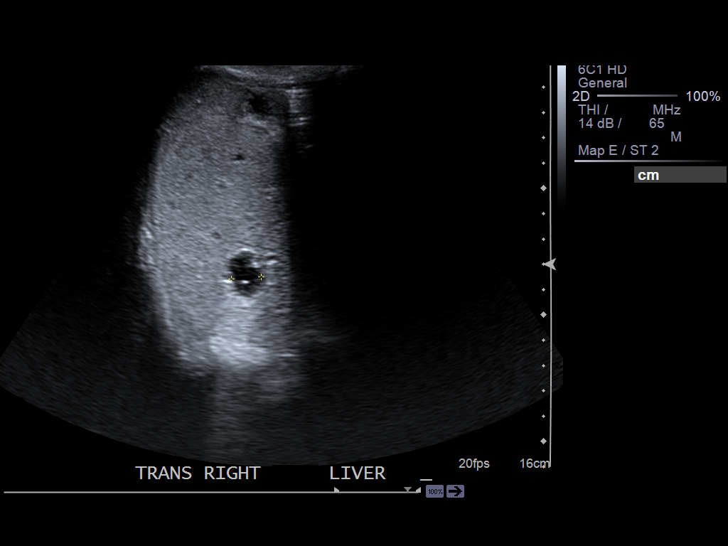
[im 31/123]
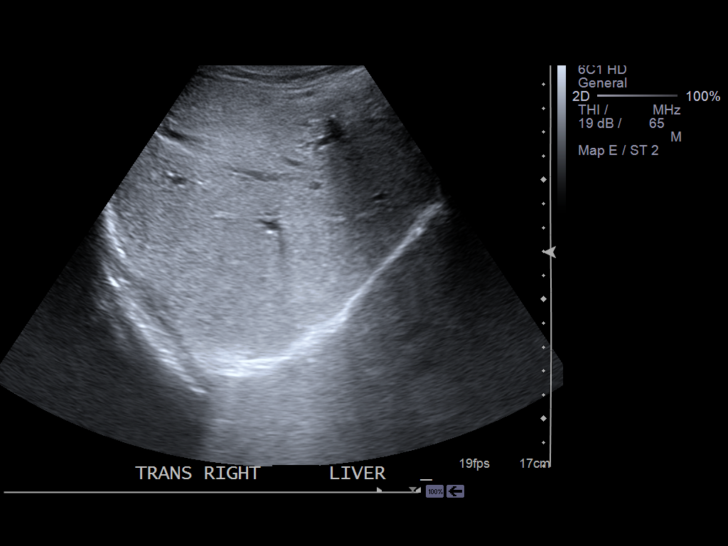
[im 41/123]
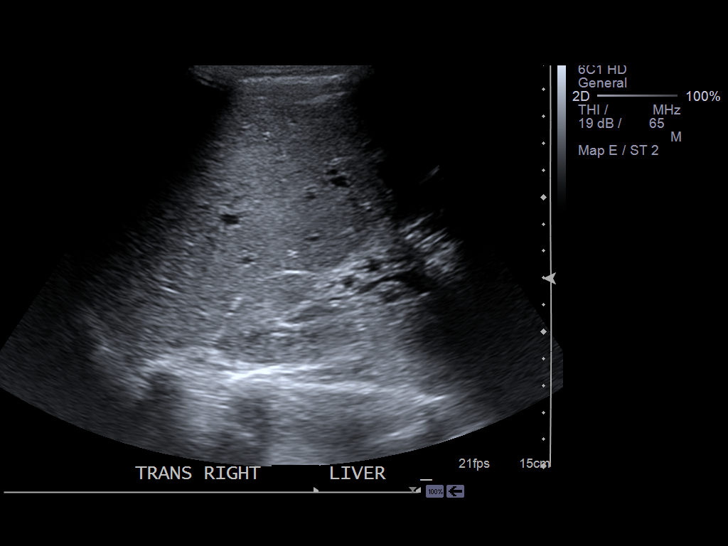
[im 51/123]
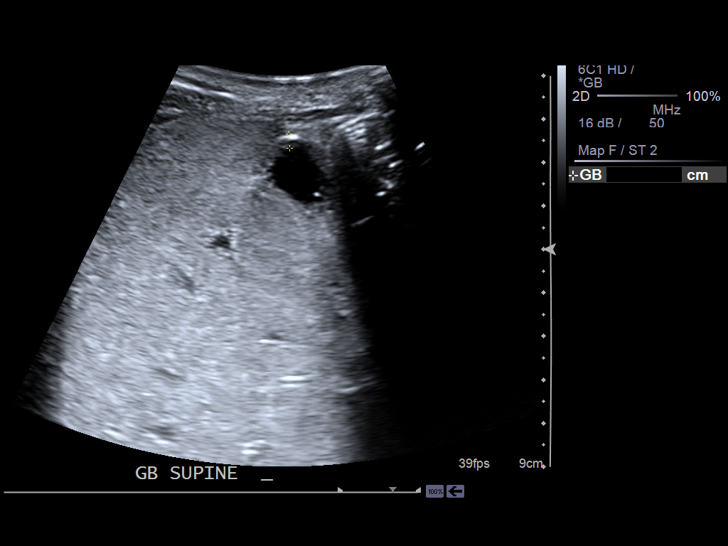
[im 62/123]
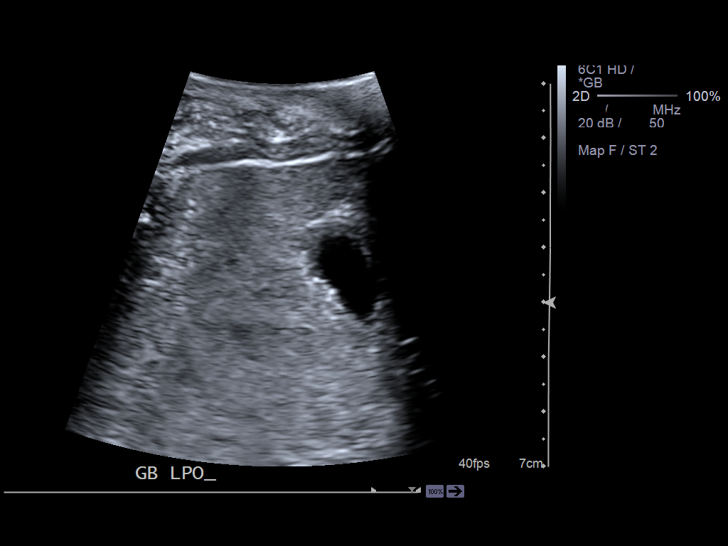
[im 72/123]
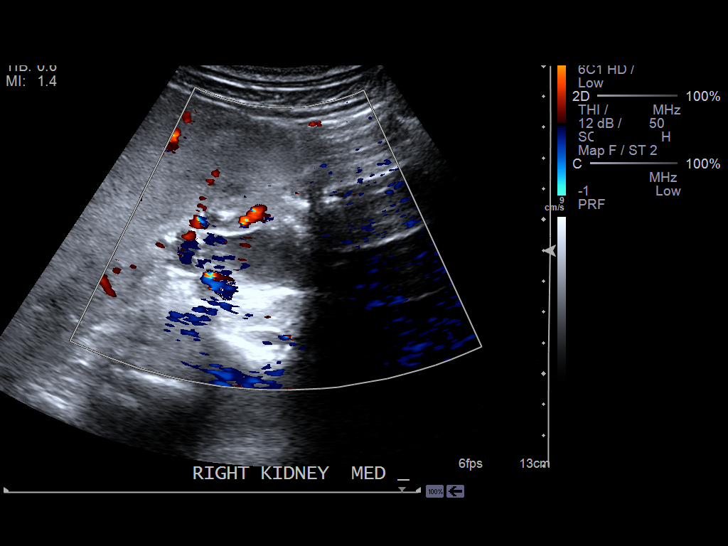
[im 82/123]
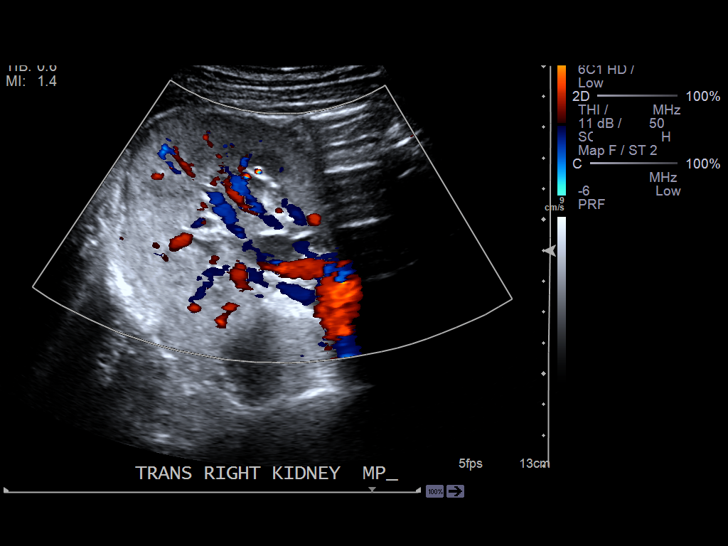
[im 92/123]
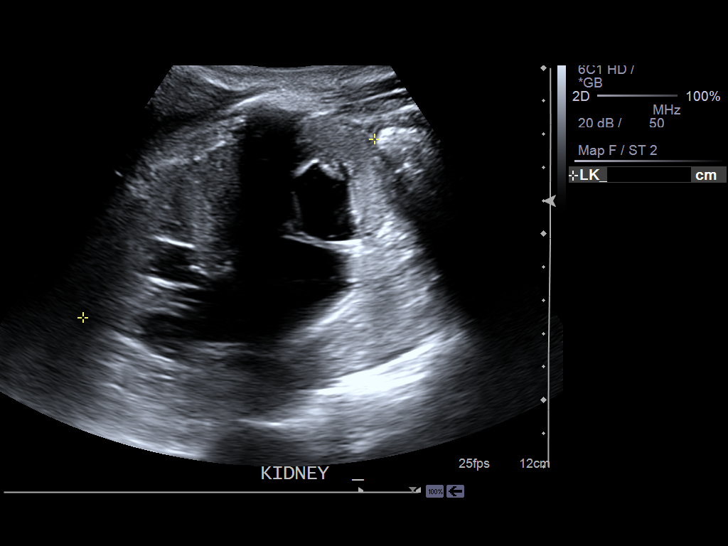
[im 102/123]
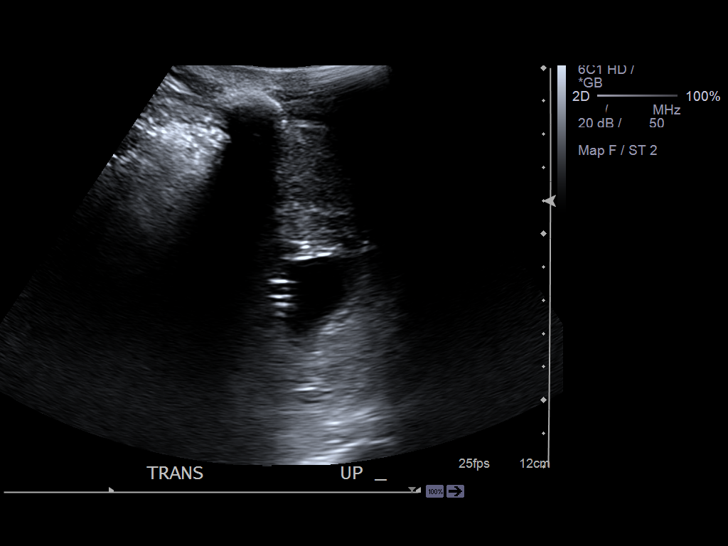
[im 112/123]
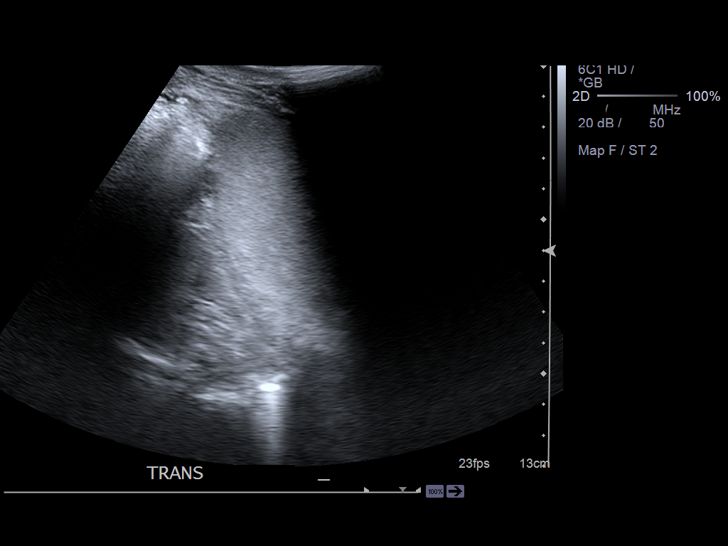
[im 123/123]
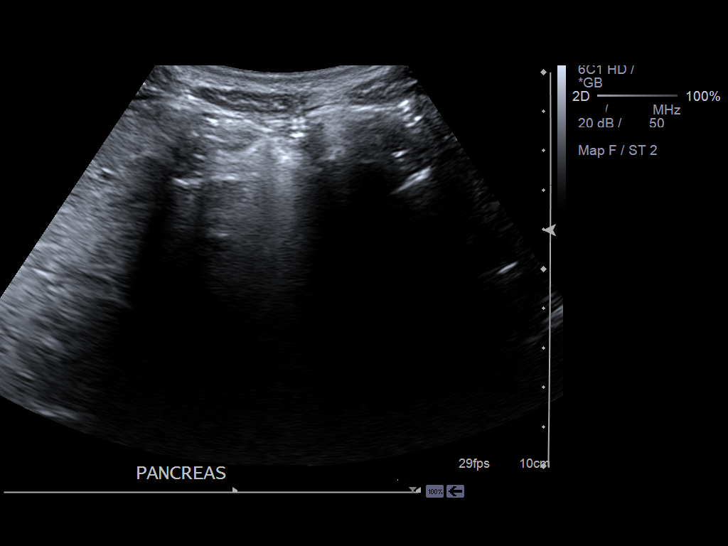

[13 of 25 positions shown; findings below may reference images not displayed]

FINDINGS: This study is limited secondary to bowel gas and patient's limited
cooperation.

Evaluation of the right lobe of the liver demonstrates a 1.4 x 1.94 cm x
1.18 cyst. The liver measures 15.65 cm along the midclavicular line.
Evaluation of the left lobe is markedly limited. Ascites is identified
within the abdomen. Hepatopetal flow is identified within the portal vein.
The aorta and IVC are unremarkable. The pancreas is not visualized. The
spleen is homogeneous in echotexture and measures 8.9 cm in longitudinal
dimensions.

The gallbladder is contracted which limits gallbladder wall evaluation.
There appear to be findings representing multiple polyps within the
gallbladder. There is no evidence of a sonographic Murphy's sign nor
intrahepatic or extrahepatic biliary ductal dilatation. The common bile duct
measures 5.7 mm in diameter.

The right kidney measures 12.09 x 5.82 x 6.04 cm and the left 10.67 x 4.43 x
6.11 cm. Renal evaluation is limited. There is decreased corticomedullary
differentiation within the kidneys a component of which may be secondary to
motion artifact. There is mild hydronephrosis within the right kidney and
moderate within the left. A small cyst is identified within the midpole of
the right kidney measuring 0.58 x 0.45 x 0.48 cm. These findings correlate
with a Lasix renogram report from 04/13/2011 consistent with more chronic
hydronephrosis.
IMPRESSION: 1. Limited abdominal ultrasound as described above.
2. Complex appearing cyst within the right lobe of the liver. This finding
can be monitored with ultrasound.
3. Mild hydronephrosis on the right, moderate on the left. When correlated
with a prior Lasix renogram it appears to be likely chronic findings and
clinical correlation recommended. There are also findings which appear to be
consistent with medical renal disease.
4. Likely multiple polyps within the gallbladder and otherwise nonspecific
findings. The gallbladder wall thickness is likely due to contraction of the
gallbladder.
